# Patient Record
Sex: Female | Born: 1995 | Race: White | Hispanic: No | Marital: Single | Smoking: Never smoker
Health system: Southern US, Community
[De-identification: ages and names within clinical notes are randomized; demographics above are authoritative.]

## PROBLEM LIST (undated history)

## (undated) DIAGNOSIS — K219 Gastro-esophageal reflux disease without esophagitis: Secondary | ICD-10-CM

## (undated) DIAGNOSIS — F32A Depression, unspecified: Secondary | ICD-10-CM

## (undated) DIAGNOSIS — F4010 Social phobia, unspecified: Secondary | ICD-10-CM

## (undated) HISTORY — DX: Gastro-esophageal reflux disease without esophagitis: K21.9

## (undated) HISTORY — DX: Social phobia, unspecified: F40.10

## (undated) HISTORY — DX: Depression, unspecified: F32.A

---
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
## 2016-06-08 ENCOUNTER — Emergency Department (HOSPITAL_COMMUNITY): Payer: Self-pay

## 2016-06-08 ENCOUNTER — Other Ambulatory Visit: Payer: Self-pay

## 2016-06-08 ENCOUNTER — Emergency Department (HOSPITAL_COMMUNITY)
Admission: EM | Admit: 2016-06-08 | Discharge: 2016-06-09 | Disposition: A | Discharge status: Home / Self Care | Payer: Self-pay | Attending: Emergency Medicine | Admitting: Emergency Medicine

## 2016-06-08 ENCOUNTER — Encounter (HOSPITAL_COMMUNITY): Payer: Self-pay | Admitting: Emergency Medicine

## 2016-06-08 DIAGNOSIS — R0602 Shortness of breath: Secondary | ICD-10-CM

## 2016-06-08 DIAGNOSIS — R072 Precordial pain: Secondary | ICD-10-CM | POA: Insufficient documentation

## 2016-06-08 DIAGNOSIS — F17200 Nicotine dependence, unspecified, uncomplicated: Secondary | ICD-10-CM | POA: Insufficient documentation

## 2016-06-08 NOTE — ED Provider Notes (Signed)
[facility]-EMER[facility]Y DEPT Provider Note   CSN: [id number] Arrival date & time: 06/08/16  2137     History   Chief Complaint Chief Complaint  Patient presents with  . Cough    HPI Rhonda Barton is a 21 y.o. female who presents emergency Department with chief complaint of shortness of breath. The patient states that she had onset of shortness of breath, which she describes as intermittent. She has associated pleuritic chest pain. Patient states that she just feels like she can't get a full breath. She had some mild coughing earlier today. She has been using her sister's albuterol inhaler and feels that has given her some relief. Her last dose was at 4 PM today. She does not use any OCP use, no recent confinement is, surgeries or injuries. She does endorse anxiety. She states she normally has an elevated heart rate, however, has been tachycardic to 140s here in the emergency department persistently. She denies fevers or chills. She is a daily smoker.  HPI  History reviewed. No pertinent past medical history.  There are no active problems to display for this patient.   History reviewed. No pertinent surgical history.  OB History    No data available       Home Medications    Prior to Admission medications   Not on File    Family History No family history on file.  Social History Social History  Substance Use Topics  . Smoking status: Current Every Day Smoker  . Smokeless tobacco: Never Used  . Alcohol use No     Allergies   Patient has no allergy information on record.   Review of Systems Review of Systems  Ten systems reviewed and are negative for acute change, except as noted in the HPI.   Physical Exam Updated Vital Signs BP (!) 147/86   Pulse (!) 141   Temp 98.7 F (37.1 C)   Resp 18   Ht 5\' 2"  (1.575 m)   Wt 72.6 kg (160 lb)   LMP 05/09/2016   SpO2 100%   BMI 29.26 kg/m   Physical Exam  Constitutional: She is oriented to person, place, and  time. She appears well-developed and well-nourished. No distress.  HENT:  Head: Normocephalic and atraumatic.  Eyes: Conjunctivae are normal. No scleral icterus.  Neck: Normal range of motion.  Cardiovascular: Regular rhythm and intact distal pulses.  Exam reveals no gallop and no friction rub.   Murmur heard. Tachycardic  Pulmonary/Chest: Effort normal and breath sounds normal. No respiratory distress.  Abdominal: Soft. Bowel sounds are normal. She exhibits no distension and no mass. There is no tenderness. There is no guarding.  Musculoskeletal:  No peripheral edema or unilateral leg swelling  Neurological: She is alert and oriented to person, place, and time.  Skin: Skin is warm and dry. She is not diaphoretic.  Psychiatric: Her behavior is normal.  Nursing note and vitals reviewed.    ED Treatments / Results  Labs (all labs ordered are listed, but only abnormal results are displayed) Labs Reviewed  BASIC METABOLIC PANEL - Abnormal; Notable for the following:       Result Value   Glucose, Bld 111 (*)    All other components within normal limits  D-DIMER, QUANTITATIVE (NOT AT [facility]) - Abnormal; Notable for the following:    D-Dimer, Quant 0.53 (*)    All other components within normal limits  BRAIN NATRIURETIC PEPTIDE  I-STAT TROPOININ, ED  I-STAT BETA HCG BLOOD, ED ([facility], [facility], [facility]  ONLY)  POCT I-STAT TROPONIN I  I-STAT BETA HCG BLOOD, ED (NOT ORDERABLE)    EKG  EKG Interpretation  Date/Time:  Monday June 08 2016 23:52:39 EDT Ventricular Rate:  125 PR Interval:    QRS Duration: 86 QT Interval:  291 QTC Calculation: 420 R Axis:   44 Text Interpretation:  Sinus tachycardia Borderline T abnormalities, diffuse leads Artifact q wave in lead III No old tracing to compare Confirmed by Devoria Albe ([id number]) on 06/09/2016 12:54:44 AM       Radiology Dg Chest 2 View  Result Date: 06/08/2016 CLINICAL DATA:  Shortness of breath, chest pain and coughing. EXAM: CHEST  2 VIEW  COMPARISON:  None. FINDINGS: Cardiomediastinal silhouette is normal. Mediastinal contours appear intact. There is no evidence of focal airspace consolidation, pleural effusion or pneumothorax. Osseous structures are without acute abnormality. Soft tissues are grossly normal. IMPRESSION: No active cardiopulmonary disease. Electronically Signed   By: Ted Mcalpine M.D.   On: 06/08/2016 22:09    Procedures Procedures (including critical care time)  Medications Ordered in ED Medications  sodium chloride 0.9 % bolus 1,000 mL (1,000 mLs Intravenous New Bag/Given 06/09/16 0109)     Initial Impression / Assessment and Plan / ED Course  I have reviewed the triage vital signs and the nursing notes.  Pertinent labs & imaging results that were available during my care of the patient were reviewed by me and considered in my medical decision making (see chart for details).  Clinical Course as of Jun 09 120  Tue Jun 09, 2016  0108 Patient's d-dimer is positive. She will require a cta  [AH]    Clinical Course User Index [AH] Arthor Captain, PA-C     Patient with tachycardia, cp, sob. CTA pending. I have given sign out to Dr. Lynelle Doctor who will assume care of the patient.  Final Clinical Impressions(s) / ED Diagnoses   Final diagnoses:  SOB (shortness of breath)    New Prescriptions New Prescriptions   No medications on file     Arthor Captain, PA-C 06/09/16 Loralie Champagne, MD 06/09/16 [phone]

## 2016-06-08 NOTE — ED Triage Notes (Signed)
Pt c/o cough and sob since yesterday. 

## 2016-06-09 ENCOUNTER — Emergency Department (HOSPITAL_COMMUNITY): Payer: Self-pay

## 2016-06-09 LAB — I-STAT BETA HCG BLOOD, ED (NOT ORDERABLE)

## 2016-06-09 LAB — BASIC METABOLIC PANEL
Anion gap: 10 (ref 5–15)
BUN: 8 mg/dL (ref 6–20)
CO2: 25 mmol/L (ref 22–32)
CREATININE: 0.68 mg/dL (ref 0.44–1.00)
Calcium: 9.4 mg/dL (ref 8.9–10.3)
Chloride: 106 mmol/L (ref 101–111)
GFR calc non Af Amer: >60 mL/min (ref >60)
Glucose, Bld: 111 mg/dL — ABNORMAL HIGH (ref 65–99)
Potassium: 3.7 mmol/L (ref 3.5–5.1)
SODIUM: 141 mmol/L (ref 135–145)

## 2016-06-09 LAB — BRAIN NATRIURETIC PEPTIDE: B NATRIURETIC PEPTIDE 5: 11 pg/mL (ref 0.0–100.0)

## 2016-06-09 LAB — D-DIMER, QUANTITATIVE (NOT AT ARMC): D DIMER QUANT: 0.53 ug{FEU}/mL — AB (ref 0.00–0.50)

## 2016-06-09 LAB — POCT I-STAT TROPONIN I: Troponin i, poc: 0 ng/mL (ref 0.00–0.08)

## 2016-06-09 MED ORDER — IOPAMIDOL (ISOVUE-370) INJECTION 76%
100.0000 mL | Freq: Once | INTRAVENOUS | Status: AC | PRN
  Administered 2016-06-09: 100 mL via INTRAVENOUS

## 2016-06-09 MED ORDER — SODIUM CHLORIDE 0.9 % IV BOLUS (SEPSIS)
1000.0000 mL | Freq: Once | INTRAVENOUS | Status: AC
  Administered 2016-06-09: 1000 mL via INTRAVENOUS

## 2016-06-09 NOTE — Discharge Instructions (Signed)
You can take ibuprofen 600 mg 4 times a day as needed for chest pain.  Take mucinex DM  OTC for cough if needed. Recheck if you get a fever, worsening cough, or you are struggling to breathe.

## 2018-01-02 IMAGING — CT CT ANGIO CHEST
2 of 6 series · 18 of 46 positions shown · IV contrast (Isovue)
Comparison: Radiographs yesterday.

CLINICAL DATA: Chest pain and shortness of breath.

EXAM:
CT ANGIOGRAPHY CHEST WITH CONTRAST
TECHNIQUE: Multidetector CT imaging of the chest was performed using the
standard protocol during bolus administration of intravenous
contrast. Multiplanar CT image reconstructions and MIPs were
obtained to evaluate the vascular anatomy.
CONTRAST:  100 cc Isovue 370 IV

[Series 7: thins · axial · 0.58mm/px · z∈[+1400,+1595]mm · 15 of 215 slices shown]
[im 10/215  lung]
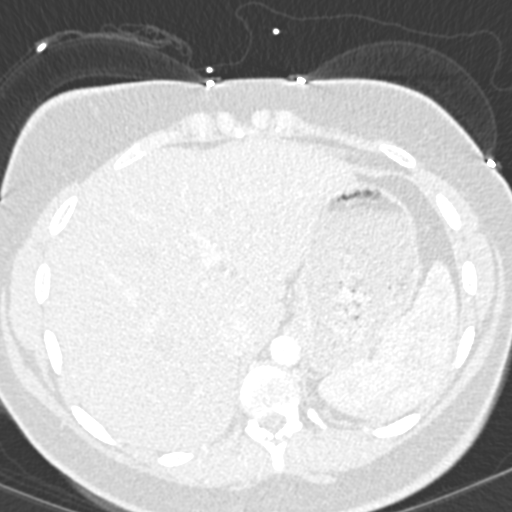
[im 28/215  soft-tissue]
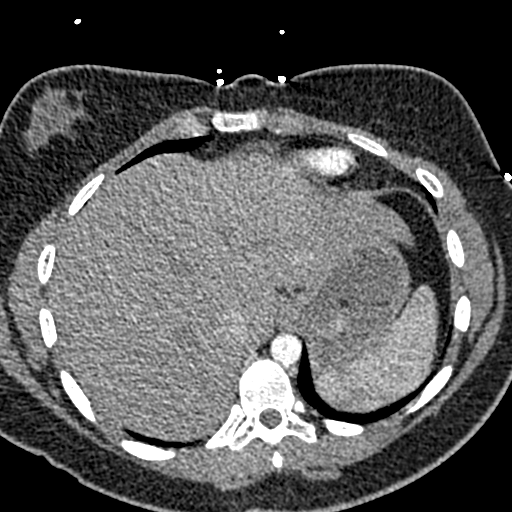
[im 38/215  lung]
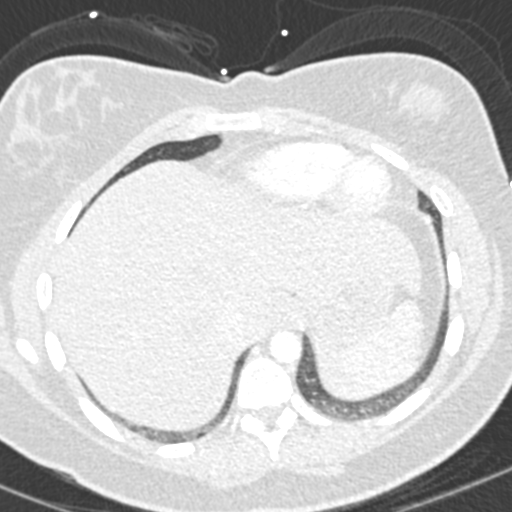
[im 56/215  soft-tissue]
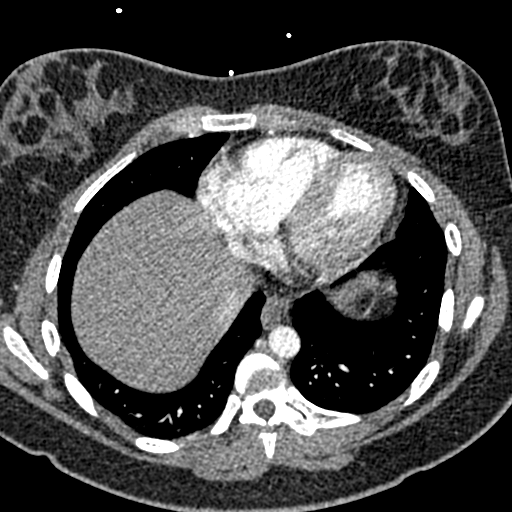
[im 66/215  lung]
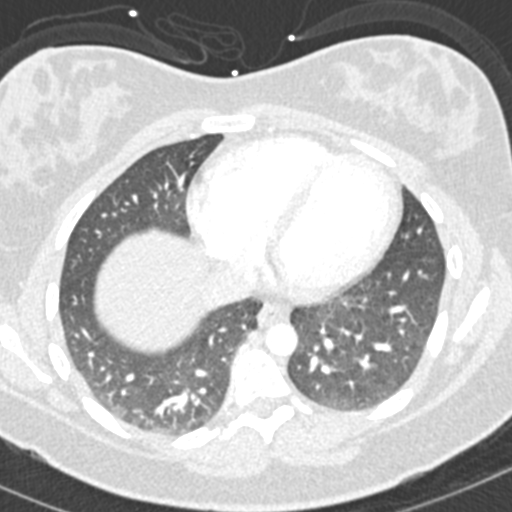
[im 84/215  soft-tissue]
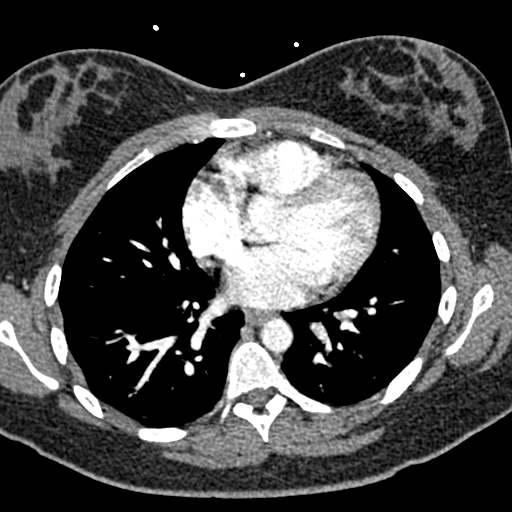
[im 94/215  lung]
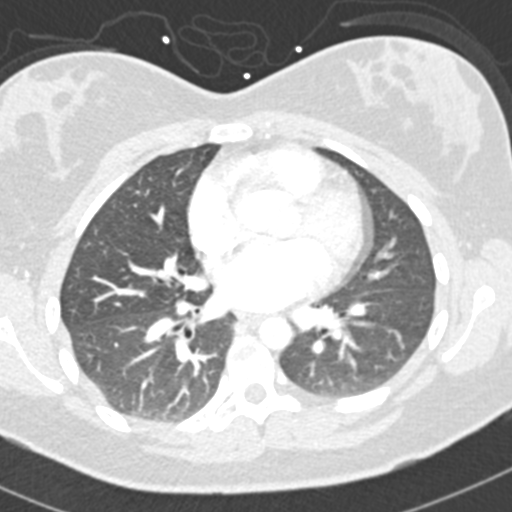
[im 112/215  soft-tissue]
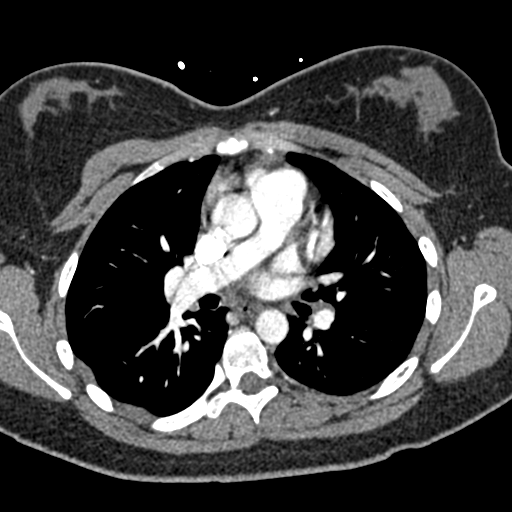
[im 121/215  lung]
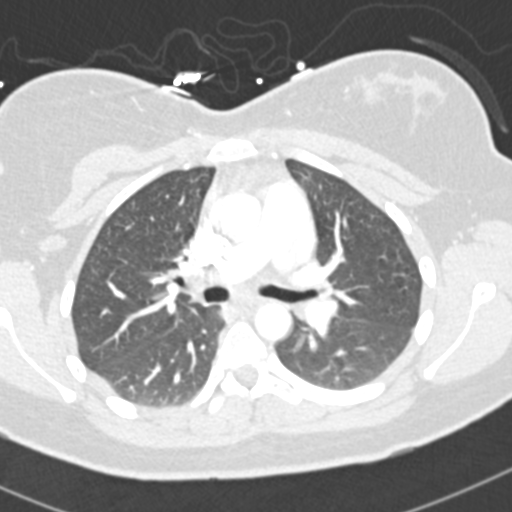
[im 131/215  soft-tissue]
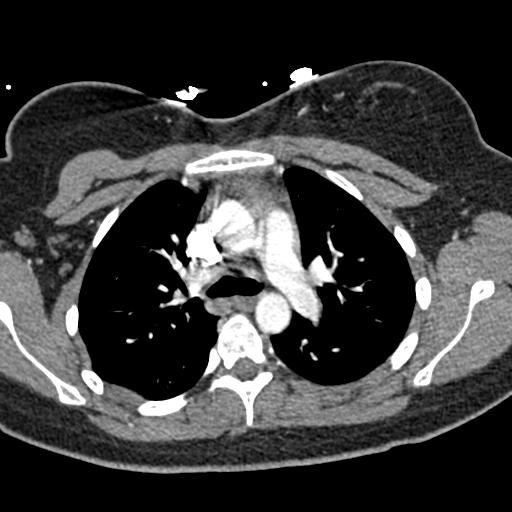
[im 149/215  lung]
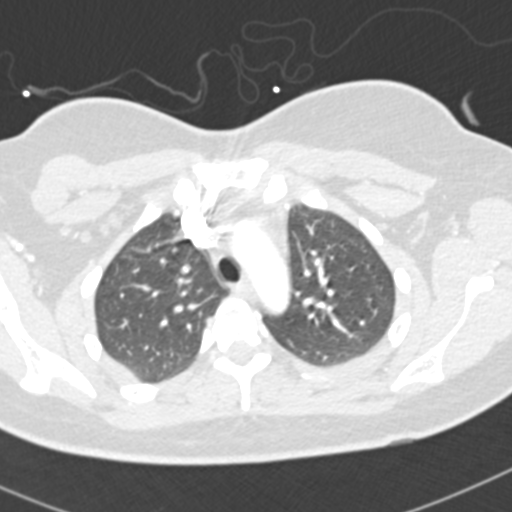
[im 159/215  soft-tissue]
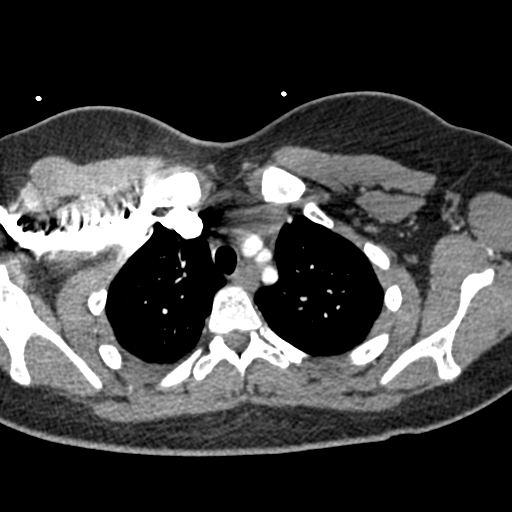
[im 177/215  lung]
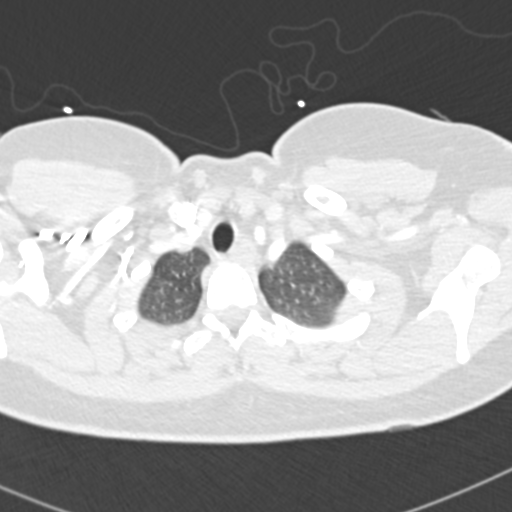
[im 187/215  soft-tissue]
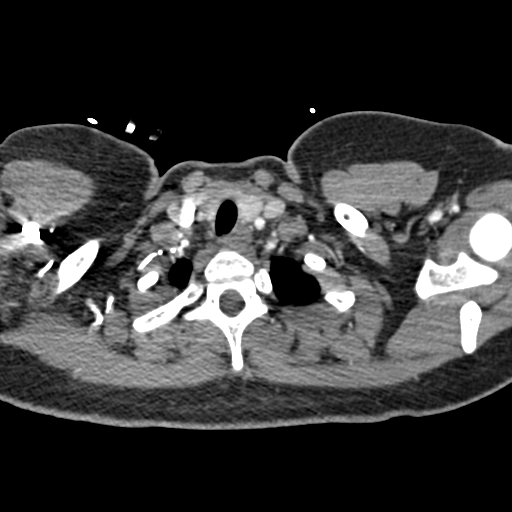
[im 205/215  lung]
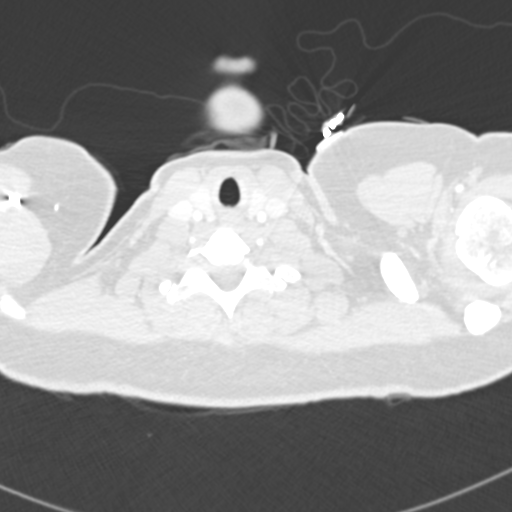

[Series 9: coronal mpr · coronal · 0.45mm/px · 3 of 120 slices shown]
[im 30/120  soft-tissue]
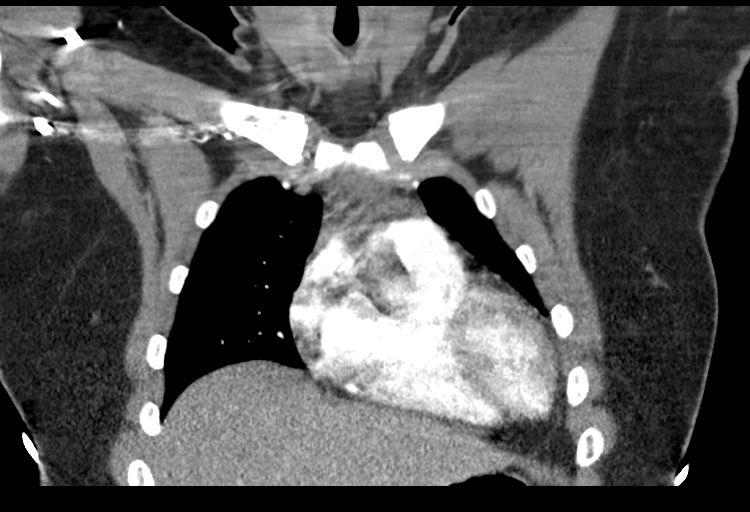
[im 60/120  soft-tissue]
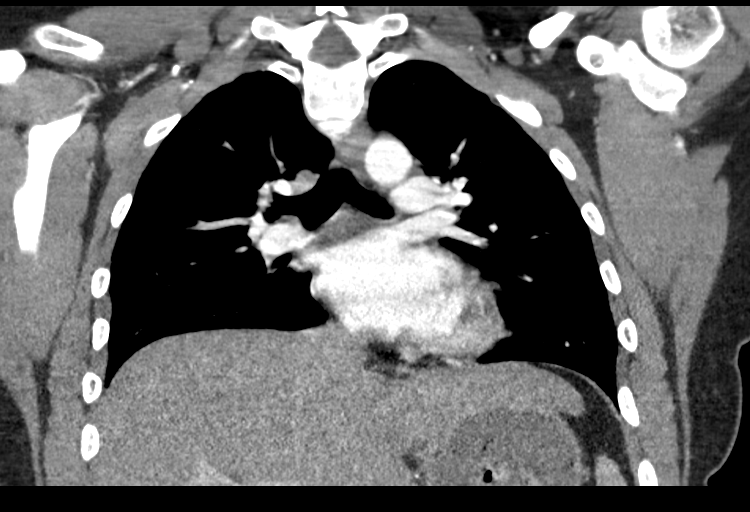
[im 90/120  soft-tissue]
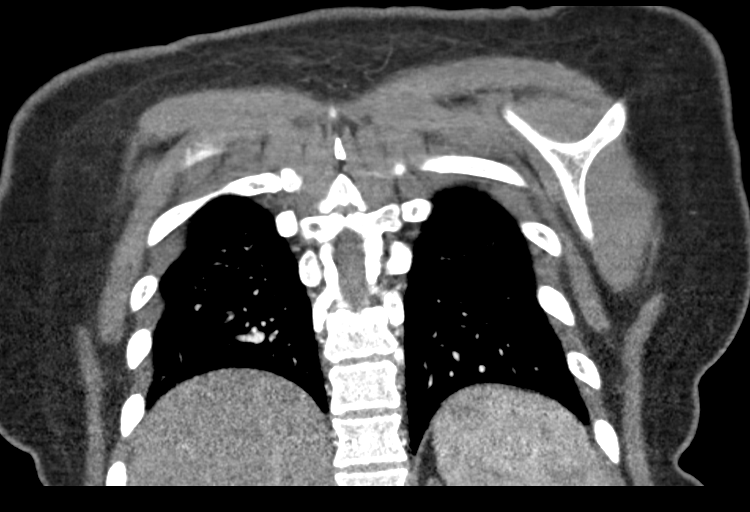

[18 of 46 positions shown; findings below may reference images not displayed]

FINDINGS: Cardiovascular: There are no filling defects within the pulmonary
arteries to suggest pulmonary embolus. No evidence of aortic
dissection allowing for cardiac motion artifact. The heart is normal
in size. No pericardial effusion.

Mediastinum/Nodes: No mediastinal or hilar adenopathy. No axillary
adenopathy. The esophagus decompressed. Visualized thyroid gland is
normal.

Lungs/Pleura: No consolidation. Mild breathing motion artifact
limits detailed assessment. No pulmonary mass or pleural fluid.
Trachea and mainstem bronchi are patent.

Upper Abdomen: No acute abnormality.

Musculoskeletal: There are no acute or suspicious osseous
abnormalities.

Review of the MIP images confirms the above findings.
IMPRESSION: No pulmonary embolus or acute intrathoracic abnormality.

## 2020-10-14 ENCOUNTER — Encounter: Payer: Self-pay | Admitting: Adult Health

## 2020-10-14 ENCOUNTER — Other Ambulatory Visit (HOSPITAL_COMMUNITY)
Admission: RE | Admit: 2020-10-14 | Discharge: 2020-10-14 | Disposition: A | Discharge status: Home / Self Care | Payer: 59 | Source: Ambulatory Visit | Attending: Adult Health | Admitting: Adult Health

## 2020-10-14 ENCOUNTER — Ambulatory Visit (INDEPENDENT_AMBULATORY_CARE_PROVIDER_SITE_OTHER): Payer: 59 | Admitting: Adult Health

## 2020-10-14 ENCOUNTER — Other Ambulatory Visit: Payer: Self-pay

## 2020-10-14 VITALS — BP 137/94 | HR 76 | Ht 63.0 in | Wt 225.0 lb

## 2020-10-14 DIAGNOSIS — N920 Excessive and frequent menstruation with regular cycle: Secondary | ICD-10-CM | POA: Diagnosis not present

## 2020-10-14 DIAGNOSIS — L680 Hirsutism: Secondary | ICD-10-CM | POA: Diagnosis not present

## 2020-10-14 DIAGNOSIS — N946 Dysmenorrhea, unspecified: Secondary | ICD-10-CM | POA: Diagnosis not present

## 2020-10-14 DIAGNOSIS — Z01419 Encounter for gynecological examination (general) (routine) without abnormal findings: Secondary | ICD-10-CM | POA: Diagnosis present

## 2020-10-14 MED ORDER — LO LOESTRIN FE 1 MG-10 MCG / 10 MCG PO TABS: 1.0000 | ORAL_TABLET | Freq: Every day | ORAL | 11 refills | Status: DC

## 2020-10-14 NOTE — Progress Notes (Signed)
Patient ID: Rhonda Barton, female   DOB: 1995/04/26, 25 y.o.   MRN: [id number] History of Present Illness:  Rhonda Barton is a 25 year old white female,single, G0P0, in with her step mom, complaining of heavy periods with clots and cramps, she had never had a pap, and she is a new pt. She started her period at 11, and it has been regular, lasts 7 days and 4 days is heavy, may change pad every 2 hours. Her sister told her to get checked for PCOs. She had labs with PCP, not sure what.  PCP is Dr Olena Leatherwood.   Current Medications, Allergies, Past Medical History, Past Surgical History, Family History and Social History were reviewed in [name] record.     Review of Systems: Patient denies any headaches, hearing loss, fatigue, blurred vision, shortness of breath, chest pain, abdominal pain, problems with bowel movements, urination, or intercourse. No joint pain or mood swings.  See HPI for positives.   Physical Exam:BP (!) 137/94 (BP Location: Left Arm, Patient Position: Sitting, Cuff Size: Large)   Pulse 76   Ht 5\' 3"  (1.6 m)   Wt 225 lb (102.1 kg)   LMP 10/06/2020 (Exact Date)   BMI 39.86 kg/m  UPT is negative.  General:  Well developed, well nourished, no acute distress Skin:  Warm and dry,has hair on chin,she plucks them out she says  Neck:  Midline trachea, normal thyroid, good ROM, no lymphadenopathy Lungs; Clear to auscultation bilaterally Breast:  No dominant palpable mass, retraction, or nipple discharge Cardiovascular: Regular rate and rhythm Abdomen:  Soft, non tender, no hepatosplenomegaly,has dark hair  Pelvic:  External genitalia is normal in appearance, no lesions.  The vagina is normal in appearance.+blood. Urethra has no lesions or masses. The cervix is smooth, pap with GC/CHL performed.  Uterus is felt to be normal size, shape, and contour.  No adnexal masses or tenderness noted.Bladder is non tender, no masses felt. Extremities/musculoskeletal:  No  swelling or varicosities noted, no clubbing or cyanosis Psych:  No mood changes, alert and cooperative,seems happy AA is 0  Fall risk is low Depression screen PHQ 2/9 10/14/2020  Decreased Interest 3  Down, Depressed, Hopeless 1  PHQ - 2 Score 4  Altered sleeping 3  Tired, decreased energy 3  Change in appetite 2  Feeling bad or failure about yourself  2  Trouble concentrating 0  Moving slowly or fidgety/restless 1  Suicidal thoughts 0  PHQ-9 Score 15   Is on Prozac from PCP GAD 7 : Generalized Anxiety Score 10/14/2020  Nervous, Anxious, on Edge 3  Control/stop worrying 3  Worry too much - different things 3  Trouble relaxing 1  Restless 0  Easily annoyed or irritable 2  Afraid - awful might happen 3  Total GAD 7 Score 15      Upstream - 10/14/20 1026       Pregnancy Intention Screening   Does the patient want to become pregnant in the next year? No    Does the patient's partner want to become pregnant in the next year? No    Would the patient like to discuss contraceptive options today? Yes      Contraception Wrap Up   Current Method Female Condom    End Method Female Condom;Oral Contraceptive    Contraception Counseling Provided Yes            Examination chaperoned by 12/14/20.  Impression and Plan: 1. Encounter for gynecological examination with Papanicolaou smear  of cervix Pap sent with GC/CHL Pap in 3 years if normal - Cytology - PAP  2. Menorrhagia with regular cycle Will check labs  - CBC - TSH - Testosterone,Free and Total Will get pelvic US to assess uterus and ovaries, for PCO Scheduled for her 10/21/20 at 12:30 pm at [facility] - US PELVIC COMPLETE WITH TRANSVAGINAL; Future Will start in Lo Loestrin, 1 pack given to start today and use condoms, she denies, MI,stroke, DVT,breast cancer and migraine with aura  Meds ordered this encounter  Medications   Norethindrone-Ethinyl Estradiol-Fe Biphas (LO LOESTRIN FE) 1 MG-10 MCG / 10 MCG tablet    Sig:  Take 1 tablet by mouth daily. Take 1 daily by mouth    Dispense:  28 tablet    Refill:  11    BIN [id number], PCN [id number], GRP [id number]    Order Specific Question:   Supervising Provider    Answer:   Duane Lope H [[id number]]   Follow up with me 11/05/20   3. Dysmenorrhea  - US PELVIC COMPLETE WITH TRANSVAGINAL; Future  4. Hirsutism - Testosterone,Free and Total

## 2020-10-15 LAB — CYTOLOGY - PAP
Chlamydia: NEGATIVE
Comment: NEGATIVE
Comment: NORMAL
Diagnosis: NEGATIVE
Neisseria Gonorrhea: NEGATIVE

## 2020-10-15 LAB — CBC
Hematocrit: 38.7 % (ref 34.0–46.6)
Hemoglobin: 12.7 g/dL (ref 11.1–15.9)
MCH: 25.8 pg — ABNORMAL LOW (ref 26.6–33.0)
MCHC: 32.8 g/dL (ref 31.5–35.7)
MCV: 79 fL (ref 79–97)
Platelets: 330 10*3/uL (ref 150–450)
RBC: 4.93 x10E6/uL (ref 3.77–5.28)
RDW: 14.4 % (ref 11.7–15.4)
WBC: 7 10*3/uL (ref 3.4–10.8)

## 2020-10-15 LAB — TSH: TSH: 1.51 u[IU]/mL (ref 0.450–4.500)

## 2020-10-15 LAB — TESTOSTERONE,FREE AND TOTAL
Testosterone, Free: 1.2 pg/mL (ref 0.0–4.2)
Testosterone: 10 ng/dL — ABNORMAL LOW (ref 13–71)

## 2020-10-21 ENCOUNTER — Ambulatory Visit (HOSPITAL_COMMUNITY): Payer: 59

## 2020-10-25 ENCOUNTER — Ambulatory Visit (HOSPITAL_COMMUNITY)
Admission: RE | Admit: 2020-10-25 | Discharge: 2020-10-25 | Disposition: A | Discharge status: Home / Self Care | Payer: 59 | Source: Ambulatory Visit | Attending: Adult Health | Admitting: Adult Health

## 2020-10-25 ENCOUNTER — Other Ambulatory Visit: Payer: Self-pay

## 2020-10-25 DIAGNOSIS — N946 Dysmenorrhea, unspecified: Secondary | ICD-10-CM | POA: Insufficient documentation

## 2020-10-25 DIAGNOSIS — N920 Excessive and frequent menstruation with regular cycle: Secondary | ICD-10-CM | POA: Diagnosis present

## 2020-11-05 ENCOUNTER — Ambulatory Visit: Payer: 59 | Admitting: Adult Health

## 2020-11-13 ENCOUNTER — Ambulatory Visit: Payer: 59 | Admitting: Adult Health

## 2020-11-20 ENCOUNTER — Telehealth: Payer: Self-pay | Admitting: Adult Health

## 2020-11-20 NOTE — Telephone Encounter (Signed)
Spoke with patient about her bleeding. She is having some spotting half way through her second pack. I encouraged her to continue taking pills at the same time everyday. Patient has a followup scheduled with Victorino Dike next week. No other questions at this time.

## 2020-11-20 NOTE — Telephone Encounter (Signed)
Pt just started her 2nd pack of BC pills, is spotting, should she be worried  Please advise & call pt

## 2020-11-26 ENCOUNTER — Ambulatory Visit (INDEPENDENT_AMBULATORY_CARE_PROVIDER_SITE_OTHER): Payer: 59 | Admitting: Adult Health

## 2020-11-26 ENCOUNTER — Encounter: Payer: Self-pay | Admitting: Adult Health

## 2020-11-26 ENCOUNTER — Other Ambulatory Visit: Payer: Self-pay

## 2020-11-26 VITALS — BP 139/90 | HR 105 | Ht 63.0 in | Wt 223.0 lb

## 2020-11-26 DIAGNOSIS — Z7689 Persons encountering health services in other specified circumstances: Secondary | ICD-10-CM

## 2020-11-26 DIAGNOSIS — N920 Excessive and frequent menstruation with regular cycle: Secondary | ICD-10-CM | POA: Diagnosis not present

## 2020-11-26 DIAGNOSIS — N946 Dysmenorrhea, unspecified: Secondary | ICD-10-CM | POA: Diagnosis not present

## 2020-11-26 NOTE — Progress Notes (Signed)
  Subjective:     Patient ID: Rhonda Barton, female   DOB: 1995/01/13, 25 y.o.   MRN: [id number]  HPI Oza is a 25 year old white female,single, G0P0, back in follow up on starting Lo  Loestrin, and periods are better.  Lab Results  Component Value Date   DIAGPAP  10/14/2020    - Negative for intraepithelial lesion or malignancy (NILM)     Review of Systems Periods lighter Decrease in cramping  Reviewed past medical,surgical, social and family history. Reviewed medications and allergies.     Objective:   Physical Exam BP 139/90 (BP Location: Right Arm, Patient Position: Sitting, Cuff Size: Large)   Pulse (!) 105   Ht 5\' 3"  (1.6 m)   Wt 223 lb (101.2 kg)   LMP 10/14/2020 (Exact Date) Comment: Spotting several days now  BMI 39.50 kg/m     Skin warm and dry. Neck: mid line trachea, normal thyroid, good ROM, no lymphadenopathy noted. Lungs: clear to ausculation bilaterally. Cardiovascular: regular rate and rhythm.  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  Upstream - 11/26/20 0931       Pregnancy Intention Screening   Does the patient want to become pregnant in the next year? No    Does the patient's partner want to become pregnant in the next year? No    Would the patient like to discuss contraceptive options today? No      Contraception Wrap Up   Current Method Oral Contraceptive    End Method Oral Contraceptive    Contraception Counseling Provided No             Assessment:     1. Encounter for menstrual regulation Continue Lo Loestrin, has refills     2. Menorrhagia with regular cycle, much better   3. Dysmenorrhea,resolved   Plan:     Follow up in 3 months

## 2021-02-26 ENCOUNTER — Ambulatory Visit (INDEPENDENT_AMBULATORY_CARE_PROVIDER_SITE_OTHER): Payer: 59 | Admitting: Adult Health

## 2021-02-26 ENCOUNTER — Other Ambulatory Visit: Payer: Self-pay

## 2021-02-26 ENCOUNTER — Encounter: Payer: Self-pay | Admitting: Adult Health

## 2021-02-26 VITALS — BP 130/90 | HR 90 | Ht 63.0 in | Wt 230.0 lb

## 2021-02-26 DIAGNOSIS — Z7689 Persons encountering health services in other specified circumstances: Secondary | ICD-10-CM | POA: Diagnosis not present

## 2021-02-26 DIAGNOSIS — Z3041 Encounter for surveillance of contraceptive pills: Secondary | ICD-10-CM | POA: Diagnosis not present

## 2021-02-26 DIAGNOSIS — N946 Dysmenorrhea, unspecified: Secondary | ICD-10-CM | POA: Diagnosis not present

## 2021-02-26 DIAGNOSIS — N920 Excessive and frequent menstruation with regular cycle: Secondary | ICD-10-CM

## 2021-02-26 NOTE — Progress Notes (Signed)
°  Subjective:     Patient ID: Rhonda Barton, female   DOB: 1995/10/31, 26 y.o.   MRN: [id number]  HPI Rhonda Barton is a 26 year old white female,single, G0P0 back in follow up on taking lo Loestrin to manage periods and doing good.No heavy bleeding or cramps.   Lab Results  Component Value Date   DIAGPAP  10/14/2020    - Negative for intraepithelial lesion or malignancy (NILM)    Review of Systems Periods are good, no heavy bleeding and cramps Reviewed past medical,surgical, social and family history. Reviewed medications and allergies.     Objective:   Physical Exam BP 130/90 (BP Location: Left Arm, Patient Position: Sitting, Cuff Size: Large)    Pulse 90    Ht 5\' 3"  (1.6 m)    Wt 230 lb (104.3 kg)    BMI 40.74 kg/m     Skin warm and dry. Lungs: clear to ausculation bilaterally. Cardiovascular: regular rate and rhythm.  Fall risk is low  Upstream - 02/26/21 0931       Pregnancy Intention Screening   Does the patient want to become pregnant in the next year? No    Does the patient's partner want to become pregnant in the next year? No    Would the patient like to discuss contraceptive options today? No      Contraception Wrap Up   Current Method Oral Contraceptive    End Method Oral Contraceptive    Contraception Counseling Provided No             Assessment:    1. Encounter for surveillance of contraceptive pills Continue lo Loestrin 1 daily, has refills  2. Encounter for menstrual regulation Will continue lo loestrin  3. Menorrhagia with regular cycle, heavy bleeding has resolved with Lo Loestrin   4. Dysmenorrhea, has resolved with COCs     Plan:     Follow up in 8 months for physical

## 2021-09-09 ENCOUNTER — Telehealth: Payer: Self-pay | Admitting: *Deleted

## 2021-09-09 ENCOUNTER — Other Ambulatory Visit: Payer: Self-pay | Admitting: Adult Health

## 2021-09-09 NOTE — Telephone Encounter (Signed)
Pt's insurance has approved Lo Loestrin for a quantity of 28 for 28 days. Approved for 12 months from 09/09/21-09/10/22. Pt and [facility] drug aware. JSY

## 2022-05-20 IMAGING — US US PELVIS COMPLETE WITH TRANSVAGINAL
1 series · 13 of 25 positions shown · non-contrast
Comparison: None

CLINICAL DATA: Menorrhagia

Dysmenorrhea
Back pain for 2 days
EXAM:
TRANSABDOMINAL AND TRANSVAGINAL ULTRASOUND OF PELVIS
TECHNIQUE: Both transabdominal and transvaginal ultrasound examinations of the
pelvis were performed. Transabdominal technique was performed for
global imaging of the pelvis including uterus, ovaries, adnexal
regions, and pelvic cul-de-sac. It was necessary to proceed with
endovaginal exam following the transabdominal exam to visualize the
endometrium and adnexa.

[Series 1: us pelvic complete with transvaginal · 13 of 80 slices shown]
[im 1/80]
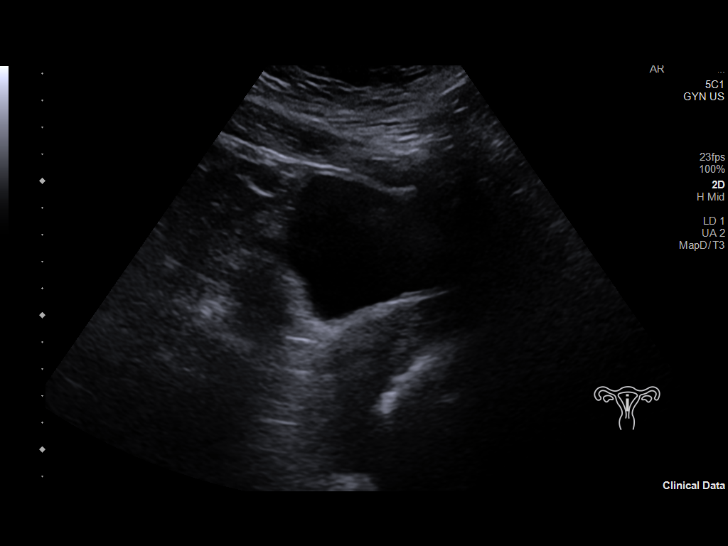
[im 7/80]
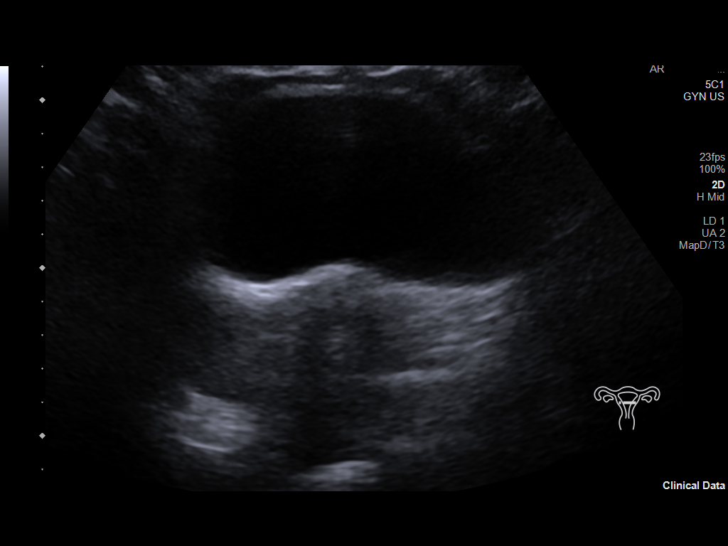
[im 14/80]
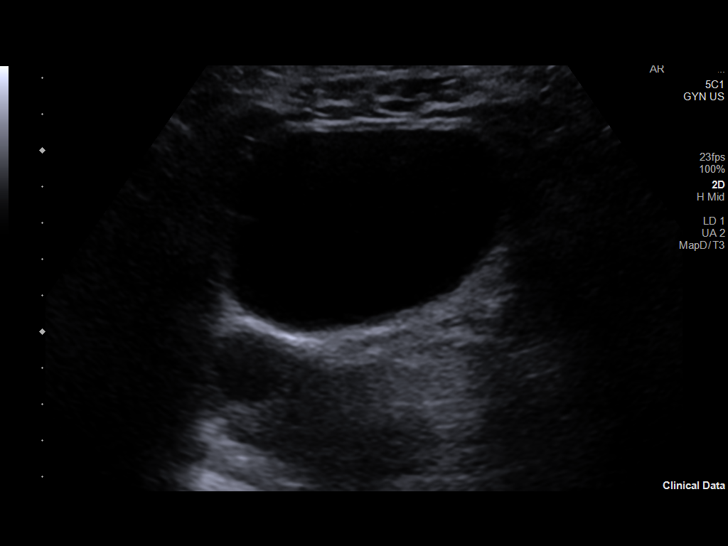
[im 20/80]
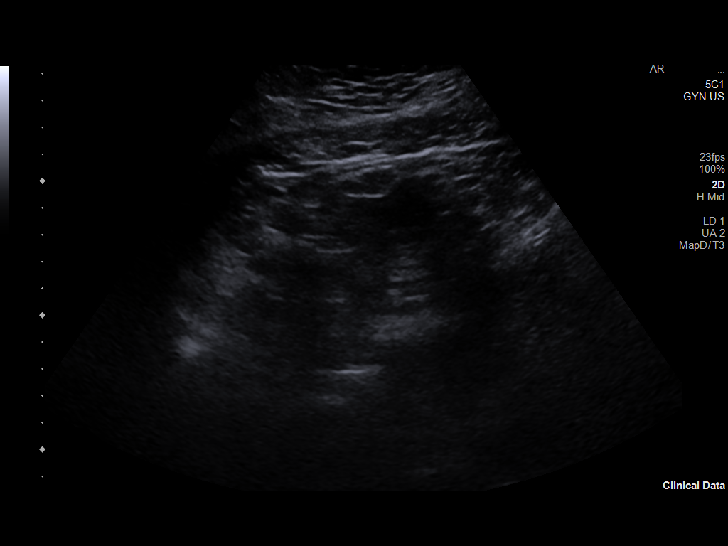
[im 27/80]
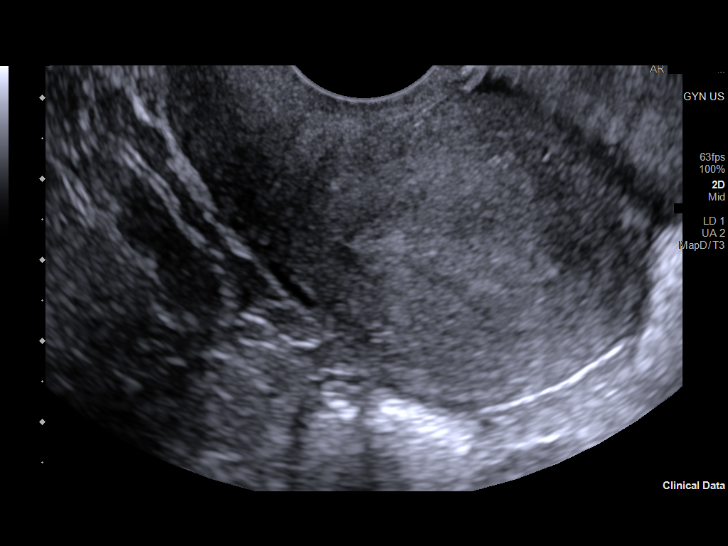
[im 33/80]
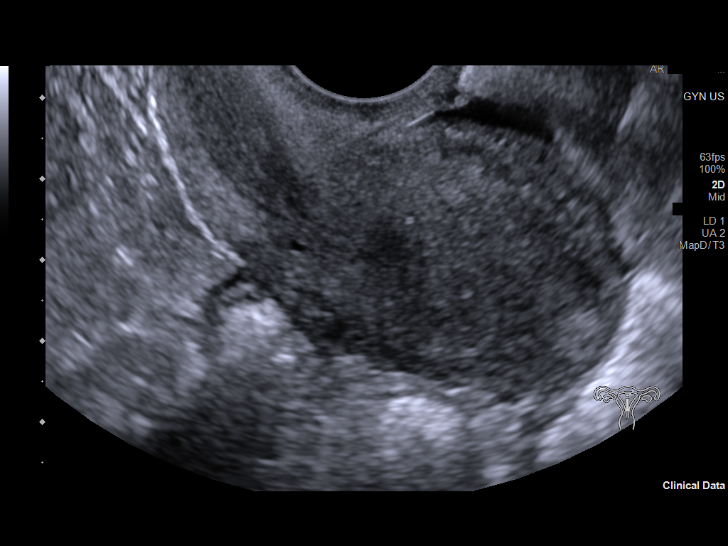
[im 40/80]
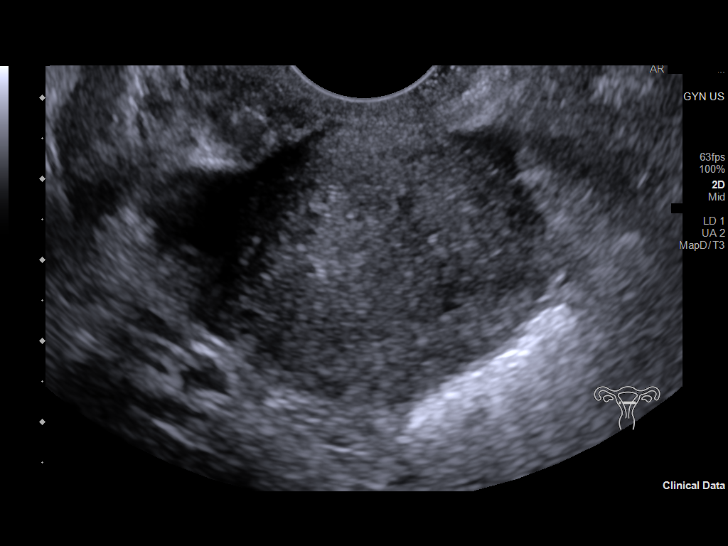
[im 47/80]
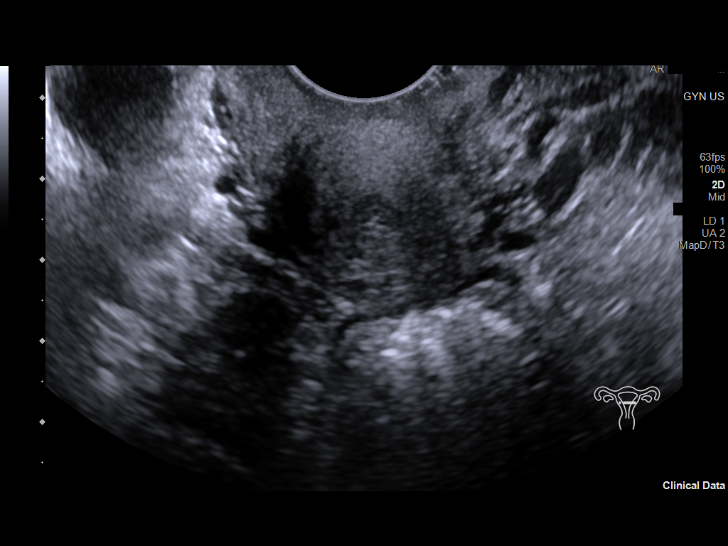
[im 53/80]
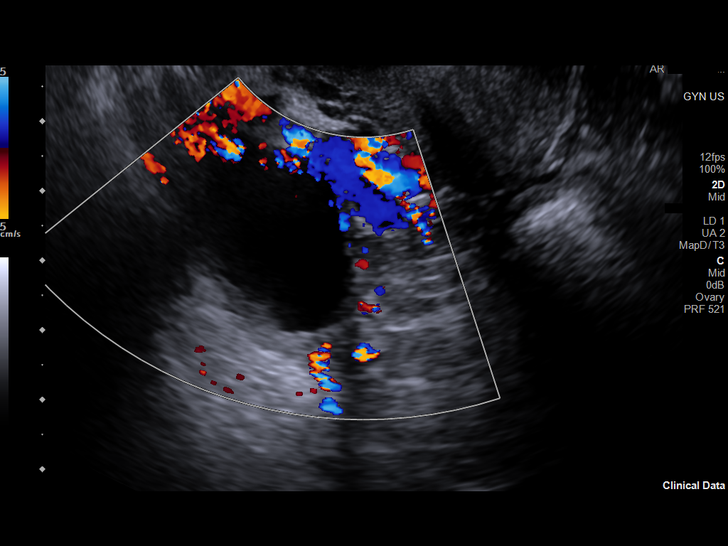
[im 60/80]
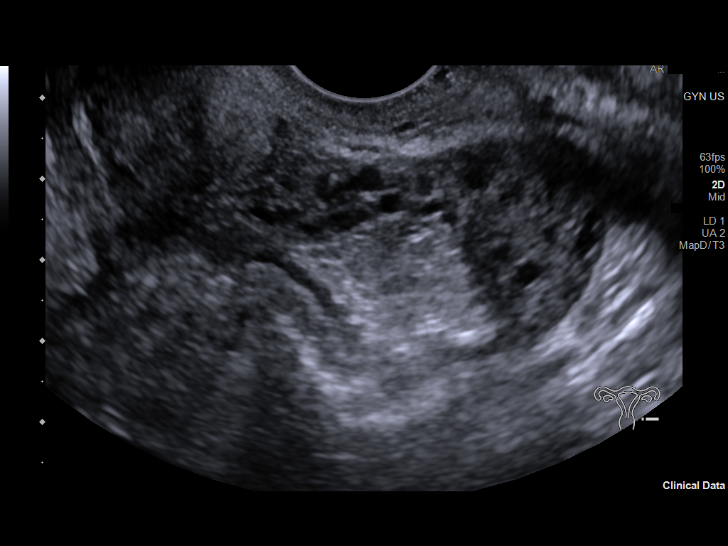
[im 66/80]
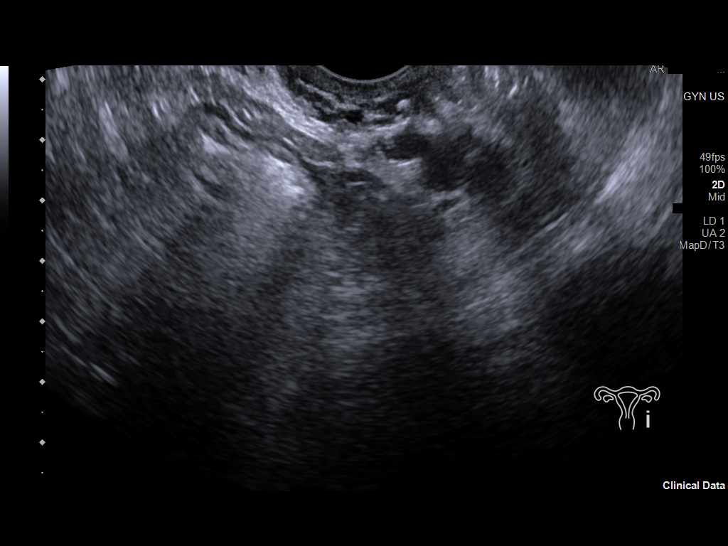
[im 73/80]
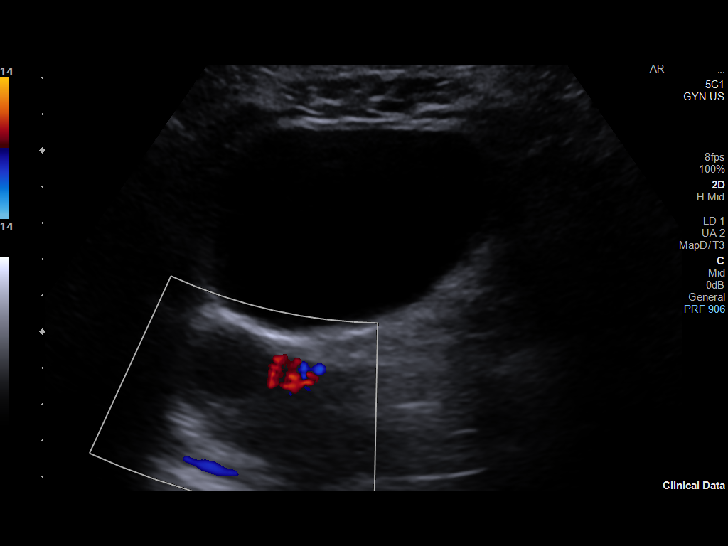
[im 80/80]
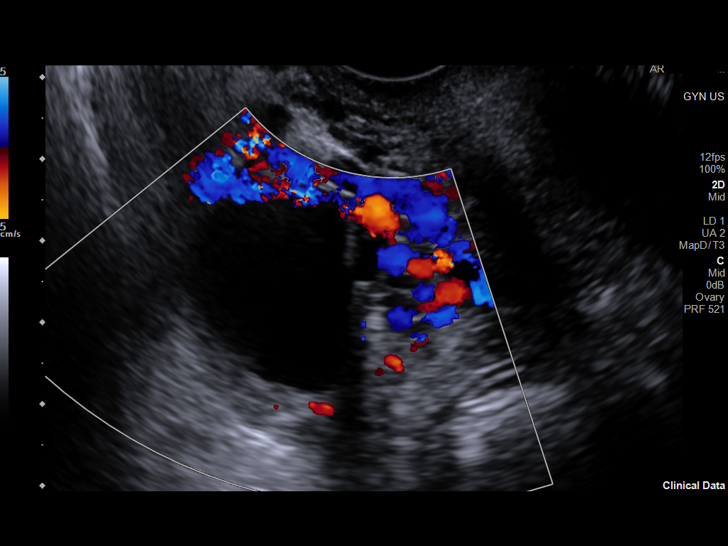

[13 of 25 positions shown; findings below may reference images not displayed]

FINDINGS: Uterus

Measurements: 6.7 x 4.0 x 3.8 cm = volume: 53 mL. No fibroids or
other mass visualized.

Endometrium

Thickness: 6 mm.  No focal abnormality visualized.

Right ovary

Measurements: 2.4 x 3.0 x 2.4 cm = volume: 10.2 mL. Normal
appearance/no adnexal mass. Dominant follicle is seen.

Left ovary

Measurements: 3.0 x 2.4 x 2.0 cm = volume: 7.6 mL. Normal
appearance/no adnexal mass.

Other findings

No abnormal free fluid.
IMPRESSION: No significant sonographic abnormality of the uterus or ovaries.

If bleeding remains unresponsive to hormonal or medical therapy,
sonohysterogram should be considered for focal lesion work-up. (Ref:
Radiological Reasoning: Algorithmic Workup of Abnormal Vaginal
Bleeding with Endovaginal Sonography and Sonohysterography. AJR
[id number]; 191:S68-73)

## 2022-06-16 ENCOUNTER — Encounter (HOSPITAL_COMMUNITY): Payer: Self-pay

## 2022-06-16 ENCOUNTER — Other Ambulatory Visit: Payer: Self-pay

## 2022-06-16 ENCOUNTER — Emergency Department (HOSPITAL_COMMUNITY)
Admission: EM | Admit: 2022-06-16 | Discharge: 2022-06-16 | Disposition: A | Discharge status: Home / Self Care | Payer: PRIVATE HEALTH INSURANCE | Attending: Emergency Medicine | Admitting: Emergency Medicine

## 2022-06-16 DIAGNOSIS — L03116 Cellulitis of left lower limb: Secondary | ICD-10-CM | POA: Insufficient documentation

## 2022-06-16 DIAGNOSIS — R21 Rash and other nonspecific skin eruption: Secondary | ICD-10-CM | POA: Diagnosis present

## 2022-06-16 DIAGNOSIS — L0390 Cellulitis, unspecified: Secondary | ICD-10-CM

## 2022-06-16 MED ORDER — CEFTRIAXONE SODIUM 1 G IJ SOLR
1.0000 g | Freq: Once | INTRAMUSCULAR | Status: AC
  Administered 2022-06-16: 1 g via INTRAMUSCULAR
  Filled 2022-06-16: qty 10

## 2022-06-16 MED ORDER — IBUPROFEN 800 MG PO TABS: 800.0000 mg | ORAL_TABLET | Freq: Three times a day (TID) | ORAL | 0 refills | Status: AC | PRN

## 2022-06-16 MED ORDER — DOXYCYCLINE HYCLATE 100 MG PO CAPS: 100.0000 mg | ORAL_CAPSULE | Freq: Two times a day (BID) | ORAL | 0 refills | Status: AC

## 2022-06-16 MED ORDER — IBUPROFEN 800 MG PO TABS
800.0000 mg | ORAL_TABLET | Freq: Once | ORAL | Status: AC
  Administered 2022-06-16: 800 mg via ORAL
  Filled 2022-06-16: qty 1

## 2022-06-16 MED ORDER — STERILE WATER FOR INJECTION IJ SOLN
INTRAMUSCULAR | Status: AC
  Administered 2022-06-16: 1 mL
  Filled 2022-06-16: qty 10

## 2022-06-16 NOTE — Discharge Instructions (Signed)
Follow-up with your family doctor in 2 days for recheck. °

## 2022-06-16 NOTE — ED Triage Notes (Signed)
Pt presents with possible tick bite to left leg that is now infected. Pt states that she noticed it a week ago and place is now red, draining, swollen and warm to the touch. Pt endorses pain at site.

## 2022-06-16 NOTE — ED Provider Notes (Signed)
[place] EMERGENCY DEPARTMENT AT [facility] Provider Note   CSN: [id number] Arrival date & time: 06/16/22  2050     History  Chief Complaint  Patient presents with   Insect Bite    Rhonda Barton is a 27 y.o. female.  Patient complains of pain and swelling in her left lower leg.  Patient thinks she was bit by an insect.  The history is provided by the patient and medical records. No language interpreter was used.  Rash Location: Left lower leg. Severity:  Moderate Onset quality:  Sudden Timing:  Constant Progression:  Worsening Chronicity:  New Context: not animal contact   Relieved by:  Nothing Worsened by:  Nothing Associated symptoms: no abdominal pain, no diarrhea, no fatigue and no headaches        Home Medications Prior to Admission medications   Medication Sig Start Date End Date Taking? Authorizing Provider  doxycycline (VIBRAMYCIN) 100 MG capsule Take 1 capsule (100 mg total) by mouth 2 (two) times daily. One po bid x 7 days 06/16/22  Yes Bethann Berkshire, MD  ibuprofen (ADVIL) 800 MG tablet Take 1 tablet (800 mg total) by mouth every 8 (eight) hours as needed for moderate pain. 06/16/22  Yes Bethann Berkshire, MD  famotidine (PEPCID) 40 MG tablet Take 40 mg by mouth 2 (two) times daily. 01/30/21   [provider]  FLUoxetine (PROZAC) 20 MG tablet Take 20 mg by mouth daily.    [provider]  LO LOESTRIN FE 1 MG-10 MCG / 10 MCG tablet TAKE ONE TABLET BY MOUTH DAILY 09/09/21   Adline Potter, NP      Allergies    Patient has no known allergies.    Review of Systems   Review of Systems  Constitutional:  Negative for appetite change and fatigue.  HENT:  Negative for congestion, ear discharge and sinus pressure.   Eyes:  Negative for discharge.  Respiratory:  Negative for cough.   Cardiovascular:  Negative for chest pain.  Gastrointestinal:  Negative for abdominal pain and diarrhea.  Genitourinary:  Negative for frequency and  hematuria.  Musculoskeletal:  Negative for back pain.  Skin:  Positive for rash.  Neurological:  Negative for seizures and headaches.  Psychiatric/Behavioral:  Negative for hallucinations.     Physical Exam Updated Vital Signs BP 136/89 (BP Location: Right Arm)   Pulse 92   Temp 98.9 F (37.2 C) (Oral)   Resp 15   SpO2 98%  Physical Exam Vitals and nursing note reviewed.  Constitutional:      Appearance: She is well-developed.  HENT:     Head: Normocephalic.  Eyes:     General: No scleral icterus.    Conjunctiva/sclera: Conjunctivae normal.  Neck:     Thyroid: No thyromegaly.  Cardiovascular:     Rate and Rhythm: Normal rate and regular rhythm.     Heart sounds: No murmur heard.    No friction rub. No gallop.  Pulmonary:     Breath sounds: No stridor. No wheezing or rales.  Chest:     Chest wall: No tenderness.  Abdominal:     General: There is no distension.     Tenderness: There is no abdominal tenderness. There is no rebound.  Musculoskeletal:        General: Normal range of motion.     Cervical back: Neck supple.     Comments: Cellulitis left lower leg  Lymphadenopathy:     Cervical: No cervical adenopathy.  Skin:  Findings: No erythema or rash.  Neurological:     Mental Status: She is alert and oriented to person, place, and time.     Motor: No abnormal muscle tone.     Coordination: Coordination normal.  Psychiatric:        Behavior: Behavior normal.     ED Results / Procedures / Treatments   Labs (all labs ordered are listed, but only abnormal results are displayed) Labs Reviewed - No data to display  EKG None  Radiology No results found.  Procedures Procedures    Medications Ordered in ED Medications  cefTRIAXone (ROCEPHIN) injection 1 g (1 g Intramuscular Given 06/16/22 2251)  ibuprofen (ADVIL) tablet 800 mg (800 mg Oral Given 06/16/22 2250)  sterile water (preservative free) injection (1 mL  Given 06/16/22 2253)    ED Course/  Medical Decision Making/ A&P                             Medical Decision Making Risk Prescription drug management.   Patient with cellulitis to left lower leg and possibly early abscess forming.  She is given injection of Rocephin and improved with doxycycline and will be rechecked in 2 days.  Patient may need drainage if it is not healing        Final Clinical Impression(s) / ED Diagnoses Final diagnoses:  Cellulitis, unspecified cellulitis site    Rx / DC Orders ED Discharge Orders          Ordered    doxycycline (VIBRAMYCIN) 100 MG capsule  2 times daily        06/16/22 2257    ibuprofen (ADVIL) 800 MG tablet  Every 8 hours PRN        06/16/22 2257              Bethann Berkshire, MD 06/20/22 1231

## 2022-11-06 ENCOUNTER — Other Ambulatory Visit: Payer: Self-pay | Admitting: Adult Health

## 2022-12-02 ENCOUNTER — Encounter: Payer: Self-pay | Admitting: *Deleted

## 2022-12-02 NOTE — Progress Notes (Signed)
1 sample box of Lo Loestrin given Exp 12/25, Lot # [id number] A picked up by stepmother

## 2023-01-08 ENCOUNTER — Other Ambulatory Visit: Payer: Self-pay | Admitting: Adult Health

## 2023-01-08 MED ORDER — NORETHIN ACE-ETH ESTRAD-FE 1-20 MG-MCG PO TABS: 1.0000 | ORAL_TABLET | Freq: Every day | ORAL | 11 refills | Status: AC
